# Patient Record
Sex: Female | Born: 1970 | Race: White | Hispanic: No | Marital: Married | Smoking: Former smoker
Health system: Southern US, Community
[De-identification: ages and names within clinical notes are randomized; demographics above are authoritative.]

## PROBLEM LIST (undated history)

## (undated) DIAGNOSIS — E079 Disorder of thyroid, unspecified: Secondary | ICD-10-CM

## (undated) HISTORY — PX: SPINE SURGERY: SHX786

## (undated) HISTORY — DX: Disorder of thyroid, unspecified: E07.9

---

## 1998-03-09 ENCOUNTER — Other Ambulatory Visit: Admission: RE | Admit: 1998-03-09 | Discharge: 1998-03-09 | Payer: Self-pay | Admitting: *Deleted

## 1999-03-22 ENCOUNTER — Other Ambulatory Visit: Admission: RE | Admit: 1999-03-22 | Discharge: 1999-03-22 | Payer: Self-pay | Admitting: *Deleted

## 2000-03-24 ENCOUNTER — Other Ambulatory Visit: Admission: RE | Admit: 2000-03-24 | Discharge: 2000-03-24 | Payer: Self-pay | Admitting: *Deleted

## 2001-07-14 ENCOUNTER — Other Ambulatory Visit: Admission: RE | Admit: 2001-07-14 | Discharge: 2001-07-14 | Payer: Self-pay | Admitting: Obstetrics & Gynecology

## 2002-08-05 ENCOUNTER — Other Ambulatory Visit: Admission: RE | Admit: 2002-08-05 | Discharge: 2002-08-05 | Payer: Self-pay | Admitting: Obstetrics & Gynecology

## 2003-03-02 ENCOUNTER — Encounter: Admission: RE | Admit: 2003-03-02 | Discharge: 2003-03-02 | Payer: Self-pay | Admitting: Unknown Physician Specialty

## 2003-03-16 ENCOUNTER — Ambulatory Visit (HOSPITAL_COMMUNITY): Admission: RE | Admit: 2003-03-16 | Discharge: 2003-03-17 | Payer: Self-pay | Admitting: Neurosurgery

## 2003-03-27 ENCOUNTER — Inpatient Hospital Stay (HOSPITAL_COMMUNITY): Admission: AD | Admit: 2003-03-27 | Discharge: 2003-04-01 | Payer: Self-pay | Admitting: Neurosurgery

## 2004-05-29 ENCOUNTER — Inpatient Hospital Stay (HOSPITAL_COMMUNITY): Admission: AD | Admit: 2004-05-29 | Discharge: 2004-05-31 | Payer: Self-pay | Admitting: Obstetrics and Gynecology

## 2004-06-01 ENCOUNTER — Encounter: Admission: RE | Admit: 2004-06-01 | Discharge: 2004-07-01 | Payer: Self-pay | Admitting: Obstetrics and Gynecology

## 2004-06-20 ENCOUNTER — Ambulatory Visit: Payer: Self-pay

## 2004-06-20 ENCOUNTER — Ambulatory Visit: Payer: Self-pay | Admitting: Family Medicine

## 2004-06-20 ENCOUNTER — Ambulatory Visit: Payer: Self-pay | Admitting: Cardiology

## 2004-07-02 ENCOUNTER — Encounter: Admission: RE | Admit: 2004-07-02 | Discharge: 2004-08-01 | Payer: Self-pay | Admitting: Obstetrics and Gynecology

## 2004-07-04 ENCOUNTER — Ambulatory Visit: Payer: Self-pay | Admitting: Family Medicine

## 2004-07-06 ENCOUNTER — Ambulatory Visit (HOSPITAL_COMMUNITY): Admission: RE | Admit: 2004-07-06 | Discharge: 2004-07-06 | Payer: Self-pay | Admitting: Obstetrics and Gynecology

## 2004-08-24 ENCOUNTER — Ambulatory Visit: Payer: Self-pay | Admitting: Family Medicine

## 2004-08-27 ENCOUNTER — Emergency Department (HOSPITAL_COMMUNITY): Admission: EM | Admit: 2004-08-27 | Discharge: 2004-08-27 | Payer: Self-pay | Admitting: Family Medicine

## 2004-09-01 ENCOUNTER — Encounter: Admission: RE | Admit: 2004-09-01 | Discharge: 2004-09-17 | Payer: Self-pay | Admitting: Obstetrics and Gynecology

## 2004-11-06 ENCOUNTER — Ambulatory Visit: Payer: Self-pay | Admitting: Family Medicine

## 2005-06-04 ENCOUNTER — Ambulatory Visit: Payer: Self-pay | Admitting: Family Medicine

## 2005-11-15 ENCOUNTER — Ambulatory Visit: Payer: Self-pay | Admitting: Family Medicine

## 2006-05-06 ENCOUNTER — Ambulatory Visit: Payer: Self-pay | Admitting: Family Medicine

## 2007-03-07 ENCOUNTER — Inpatient Hospital Stay (HOSPITAL_COMMUNITY): Admission: AD | Admit: 2007-03-07 | Discharge: 2007-03-11 | Payer: Self-pay | Admitting: Obstetrics and Gynecology

## 2007-03-12 ENCOUNTER — Encounter: Admission: RE | Admit: 2007-03-12 | Discharge: 2007-04-08 | Payer: Self-pay | Admitting: Obstetrics and Gynecology

## 2007-04-09 ENCOUNTER — Encounter: Admission: RE | Admit: 2007-04-09 | Discharge: 2007-05-09 | Payer: Self-pay | Admitting: Obstetrics and Gynecology

## 2007-05-10 ENCOUNTER — Encounter: Admission: RE | Admit: 2007-05-10 | Discharge: 2007-05-27 | Payer: Self-pay | Admitting: Obstetrics and Gynecology

## 2010-06-05 NOTE — Op Note (Signed)
[name], [name]              ACCOUNT NO.:  [id number]   MEDICAL RECORD NO.:  [id number]          PATIENT TYPE:  INP   LOCATION:  9123                          FACILITY:  [facility]   PHYSICIAN:  Lenoard Aden, M.D.DATE OF BIRTH:  [birth year]   DATE OF PROCEDURE:  03/08/2007  DATE OF DISCHARGE:                               OPERATIVE REPORT   PREOPERATIVE DIAGNOSIS:  Previous cesarean section x2 in active labor at  term.   POSTOPERATIVE DIAGNOSIS:  Previous cesarean section x2 in active labor  at term.   PROCEDURE:  Repeat low segment transverse cesarean section.   SURGEON:  Lenoard Aden, M.D.   ASSISTANT:  Fredric Mare.   ANESTHESIA:  Spinal by Hatchett.   ESTIMATED BLOOD LOSS:  1000 mL.   COMPLICATIONS:  None.   DRAINS:  Foley.   COUNTS:  Correct.   Patient to recovery in good condition.   BRIEF OP NOTE:  After being apprised of risks of anesthesia, infection,  bleeding, injury to abdominal organs and need for repair, delayed versus  immediate complications to include bowel or bladder injury, the patient  was brought to the operating room.  She was administered spinal  anesthetic without complications, prepped, draped in usual sterile  fashion.  Foley catheter placed after achieving anesthesia and Marcaine  solution placed.  Pfannenstiel skin incision made with scalpel, carried  down to fascia which is nicked in midline and extended transversely  using Mayo scissors.  Rectus muscles dissected sharply in midline,  peritoneum entered sharply.  Bladder blade placed.  Visceral peritoneum  scored sharply off the lower uterine segment, lower uterine segment is  thin and with a small 1 cm window noted.  No dehiscence of the scar at  this time.  Sharl Ma hysterotomy incision made, atraumatic delivery from an  OT position, full-term living female infant, Apgars 8/9.  Cord blood  collected.  Placenta delivered manually intact from posterior location,  three-vessel cord noted.   Uterus curetted using a dry lap pack and  closed in two running  imbricating layers of 0-0 Monocryl suture.  Irrigation accomplished.  Bladder flap inspected and found to be hemostatic.  Fascia closed using  0-0 Monocryl suture in running fashion.  Skin closed using staples.  The  patient tolerated this procedure well, transferred to recovery good  condition.      Lenoard Aden, M.D.  Electronically Signed     RJT/MEDQ  D:  03/08/2007  T:  03/09/2007  Job:  [id number]

## 2010-06-05 NOTE — Discharge Summary (Signed)
Sarah Vaughan, Sarah Vaughan              ACCOUNT NO.:  [id number]   MEDICAL RECORD NO.:  [id number]          PATIENT TYPE:  INP   LOCATION:  9123                          FACILITY:  [facility]   PHYSICIAN:  Lenoard Aden, M.D.DATE OF BIRTH:  1970/03/30   DATE OF ADMISSION:  03/08/2007  DATE OF DISCHARGE:  03/11/2007                               DISCHARGE SUMMARY   HISTORY OF PRESENT ILLNESS:  The patient with uncomplicated repeat C-  section for previous C-section and active labor.  Postoperative course  uncomplicated.  Tolerated regular diet well.   Discharged to home on day 3.  Discharge teaching was done.   DISCHARGE MEDICATIONS:  Tylox, prenatal vitamins, and iron given.   FOLLOWUP:  Followup in the office in 4-6 weeks.      Lenoard Aden, M.D.  Electronically Signed     RJT/MEDQ  D:  04/03/2007  T:  04/04/2007  Job:  [id number]

## 2010-06-08 NOTE — H&P (Signed)
Sarah Vaughan, Sarah Vaughan              ACCOUNT NO.:  [id number]   MEDICAL RECORD NO.:  [id number]          PATIENT TYPE:  INP   LOCATION:  9168                          FACILITY:  [facility]   PHYSICIAN:  Lenoard Aden, M.D.DATE OF BIRTH:  01/31/1970   DATE OF ADMISSION:  05/29/2004  DATE OF DISCHARGE:                                HISTORY & PHYSICAL   CHIEF COMPLAINT:  Labor.   HISTORY OF PRESENT ILLNESS:  The patient is a 39 year old white female G2 P1  at 5 and six-sevenths weeks gestation with increased frequency of  contractions. She has a history of C-section at 40 weeks for active phase  arrest in 1997. She has no known drug allergies.   MEDICATIONS:  Prenatal vitamins.   FAMILY HISTORY:  Myocardial infarction, insulin-dependent diabetes.   PERSONAL HISTORY:  An L4-L5 ruptured disc with cerebrospinal fluid leak  which has been consulted with her neurosurgeon who approves her for regional  anesthesia.   PRENATAL LABORATORY DATA:  Reveals a blood type of A positive, Rh antibody  negative. Rubella immune. Hepatitis nonreactive. HIV nonreactive. Pregnancy  course otherwise uncomplicated.   PHYSICAL EXAMINATION:  GENERAL:  She is a well-developed, well-nourished  white female in no acute distress.  HEENT:  Normal.  LUNGS:  Clear.  HEART:  Regular rate and rhythm.  ABDOMEN:  Soft, gravid, nontender. Estimated fetal weight 7-and-a-half to 8  pounds.  PELVIC:  Cervix is 2 cm, 80%, vertex, -1.  EXTREMITIES:  Reveal no cords.  NEUROLOGIC:  Nonfocal.   IMPRESSION:  1.  Thirty-nine-week OB.  2.  Previous cesarean section, for attempt at vaginal birth after cesarean.  3.  Early labor.   PLAN:  To [facility] for amniotomy, IUPC placement, augmentation with  Pitocin as needed. Anticipate attempts at vaginal delivery. Of note,  risks/benefits of VBAC versus repeat C-section are discussed, failure risk  noted. Uterine rupture rate of approximately 1-3% quoted to the patient  and  her husband. They acknowledge this and desire to proceed.      RJT/MEDQ  D:  05/29/2004  T:  05/29/2004  Job:  [id number]

## 2010-06-11 ENCOUNTER — Emergency Department (HOSPITAL_COMMUNITY)
Admission: EM | Admit: 2010-06-11 | Discharge: 2010-06-11 | Disposition: A | Discharge status: Home / Self Care | Payer: BC Managed Care – PPO | Attending: Emergency Medicine | Admitting: Emergency Medicine

## 2010-06-11 ENCOUNTER — Emergency Department (HOSPITAL_COMMUNITY): Payer: BC Managed Care – PPO

## 2010-06-11 ENCOUNTER — Inpatient Hospital Stay (INDEPENDENT_AMBULATORY_CARE_PROVIDER_SITE_OTHER)
Admission: RE | Admit: 2010-06-11 | Discharge: 2010-06-11 | Disposition: A | Discharge status: Home / Self Care | Payer: BC Managed Care – PPO | Source: Ambulatory Visit | Attending: Family Medicine | Admitting: Family Medicine

## 2010-06-11 DIAGNOSIS — R110 Nausea: Secondary | ICD-10-CM | POA: Insufficient documentation

## 2010-06-11 DIAGNOSIS — R10811 Right upper quadrant abdominal tenderness: Secondary | ICD-10-CM

## 2010-06-11 DIAGNOSIS — M549 Dorsalgia, unspecified: Secondary | ICD-10-CM | POA: Insufficient documentation

## 2010-06-11 DIAGNOSIS — R1011 Right upper quadrant pain: Secondary | ICD-10-CM | POA: Insufficient documentation

## 2010-06-11 DIAGNOSIS — R197 Diarrhea, unspecified: Secondary | ICD-10-CM

## 2010-06-11 LAB — COMPREHENSIVE METABOLIC PANEL WITH GFR
ALT: 10 U/L (ref 0–35)
AST: 17 U/L (ref 0–37)
Albumin: 3.6 g/dL (ref 3.5–5.2)
Alkaline Phosphatase: 68 U/L (ref 39–117)
BUN: 11 mg/dL (ref 6–23)
CO2: 25 meq/L (ref 19–32)
Calcium: 9 mg/dL (ref 8.4–10.5)
Chloride: 103 meq/L (ref 96–112)
Creatinine, Ser: 0.54 mg/dL (ref 0.4–1.2)
GFR calc non Af Amer: >60 mL/min
Glucose, Bld: 89 mg/dL (ref 70–99)
Potassium: 4 meq/L (ref 3.5–5.1)
Sodium: 136 meq/L (ref 135–145)
Total Bilirubin: 0.5 mg/dL (ref 0.3–1.2)
Total Protein: 6.4 g/dL (ref 6.0–8.3)

## 2010-06-11 LAB — POCT PREGNANCY, URINE: Preg Test, Ur: NEGATIVE

## 2010-06-11 LAB — CBC
HCT: 39.2 % (ref 36.0–46.0)
Hemoglobin: 13.4 g/dL (ref 12.0–15.0)
MCH: 32.5 pg (ref 26.0–34.0)
MCHC: 34.2 g/dL (ref 30.0–36.0)
MCV: 95.1 fL (ref 78.0–100.0)
Platelets: 241 10*3/uL (ref 150–400)
RBC: 4.12 MIL/uL (ref 3.87–5.11)
RDW: 12.4 % (ref 11.5–15.5)
WBC: 10.3 10*3/uL (ref 4.0–10.5)

## 2010-06-11 LAB — POCT URINALYSIS DIP (DEVICE)
Bilirubin Urine: NEGATIVE
Glucose, UA: NEGATIVE mg/dL
Ketones, ur: NEGATIVE mg/dL
Nitrite: NEGATIVE
Protein, ur: NEGATIVE mg/dL
Specific Gravity, Urine: 1.02 (ref 1.005–1.030)
Urobilinogen, UA: 0.2 mg/dL (ref 0.0–1.0)
pH: 5.5 (ref 5.0–8.0)

## 2010-06-11 LAB — DIFFERENTIAL
Basophils Absolute: 0 10*3/uL (ref 0.0–0.1)
Basophils Relative: 0 % (ref 0–1)
Eosinophils Absolute: 0.1 10*3/uL (ref 0.0–0.7)
Eosinophils Relative: 1 % (ref 0–5)

## 2010-06-11 LAB — LIPASE, BLOOD: Lipase: 21 U/L (ref 11–59)

## 2010-06-13 ENCOUNTER — Other Ambulatory Visit (HOSPITAL_COMMUNITY): Payer: Self-pay | Admitting: Family Medicine

## 2010-06-13 DIAGNOSIS — R1011 Right upper quadrant pain: Secondary | ICD-10-CM

## 2010-06-27 ENCOUNTER — Encounter (HOSPITAL_COMMUNITY)
Admission: RE | Admit: 2010-06-27 | Discharge: 2010-06-27 | Disposition: A | Discharge status: Home / Self Care | Payer: BC Managed Care – PPO | Source: Ambulatory Visit | Attending: Family Medicine | Admitting: Family Medicine

## 2010-06-27 DIAGNOSIS — R1011 Right upper quadrant pain: Secondary | ICD-10-CM | POA: Insufficient documentation

## 2010-06-27 MED ORDER — TECHNETIUM TC 99M MEBROFENIN IV KIT
5.0000 | PACK | Freq: Once | INTRAVENOUS | Status: AC | PRN
  Administered 2010-06-27: 5 via INTRAVENOUS

## 2010-07-30 ENCOUNTER — Encounter (HOSPITAL_COMMUNITY)
Admission: RE | Admit: 2010-07-30 | Discharge: 2010-07-30 | Disposition: A | Discharge status: Home / Self Care | Payer: BC Managed Care – PPO | Source: Ambulatory Visit | Attending: Neurosurgery | Admitting: Neurosurgery

## 2010-07-30 LAB — CBC
HCT: 37 % (ref 36.0–46.0)
Hemoglobin: 12.7 g/dL (ref 12.0–15.0)
MCH: 32.8 pg (ref 26.0–34.0)
MCHC: 34.3 g/dL (ref 30.0–36.0)
MCV: 95.6 fL (ref 78.0–100.0)
Platelets: 263 10*3/uL (ref 150–400)
RBC: 3.87 MIL/uL (ref 3.87–5.11)
RDW: 12.8 % (ref 11.5–15.5)
WBC: 6.2 10*3/uL (ref 4.0–10.5)

## 2010-07-30 LAB — SURGICAL PCR SCREEN
MRSA, PCR: NEGATIVE
Staphylococcus aureus: NEGATIVE

## 2010-07-30 LAB — HCG, SERUM, QUALITATIVE: Preg, Serum: NEGATIVE

## 2010-08-01 ENCOUNTER — Observation Stay (HOSPITAL_COMMUNITY): Payer: BC Managed Care – PPO

## 2010-08-01 ENCOUNTER — Observation Stay (HOSPITAL_COMMUNITY)
Admission: RE | Admit: 2010-08-01 | Discharge: 2010-08-02 | Disposition: A | Discharge status: Home / Self Care | Payer: BC Managed Care – PPO | Source: Ambulatory Visit | Attending: Neurosurgery | Admitting: Neurosurgery

## 2010-08-01 DIAGNOSIS — M47817 Spondylosis without myelopathy or radiculopathy, lumbosacral region: Secondary | ICD-10-CM | POA: Insufficient documentation

## 2010-08-01 DIAGNOSIS — Z01812 Encounter for preprocedural laboratory examination: Secondary | ICD-10-CM | POA: Insufficient documentation

## 2010-08-01 DIAGNOSIS — M5126 Other intervertebral disc displacement, lumbar region: Principal | ICD-10-CM | POA: Insufficient documentation

## 2010-08-01 DIAGNOSIS — M5137 Other intervertebral disc degeneration, lumbosacral region: Secondary | ICD-10-CM | POA: Insufficient documentation

## 2010-08-01 DIAGNOSIS — M51379 Other intervertebral disc degeneration, lumbosacral region without mention of lumbar back pain or lower extremity pain: Secondary | ICD-10-CM | POA: Insufficient documentation

## 2010-08-06 NOTE — Op Note (Signed)
Sarah Vaughan, Sarah Vaughan              ACCOUNT NO.:  [id number]  MEDICAL RECORD NO.:  [id number]  LOCATION:  3528                         FACILITY:  [facility]  PHYSICIAN:  Hewitt Shorts, M.D.DATE OF BIRTH:  09-01-1970  DATE OF PROCEDURE:  08/01/2010 DATE OF DISCHARGE:                              OPERATIVE REPORT   PREOPERATIVE DIAGNOSES:  Recurrent left L5-S1 lumbar disk herniation, lumbar degenerative disease, lumbar spondylosis and lumbar radiculopathy.  POSTOPERATIVE DIAGNOSES:  Recurrent left L5-S1 lumbar disk herniation, lumbar degenerative disease, lumbar spondylosis and lumbar radiculopathy.  PROCEDURE:  Left L5-S1 lumbar laminotomy and microdiskectomy with microdissection and the operative microscope.  SURGEON:  Hewitt Shorts, MD.  ASSISTANCE:  Dr. Venetia Maxon.  ANESTHESIA:  General endotracheal.  INDICATIONS:  The patient is a 40 year old woman about 7 years status post previous lumbar diskectomy.  It was complicated by a delayed postoperative cerebrospinal fluid leak that required reoperation, but she has done up until a couple of months ago which developed recurrent left lumbar radicular pain.  MRI scan revealed a recurrent left L5-S1 lumbar disk herniation.  A decision was made to proceed with laminotomy and microdiskectomy.  PROCEDURE:  The patient was brought to the operating room, placed under general endotracheal anesthesia.  The patient was turned to prone position.  Lumbar region was prepped with Betadine soap solution and draped in a sterile fashion.  The midline was infiltrated with local anesthetic with epinephrine and x-ray was taken.  The L5-S1 level was identified and then a previous midline incision was reopened and dissection was carried down through the subcutaneous tissue.  Bipolar cautery and electrocautery were used to maintain hemostasis.  Dissection was carried down to the lumbar fascia, which was incised on the left side of the midline.   The paraspinal muscles were dissected from the spinous process and lamina in a subperiosteal fashion.  We identified the lamina of S1 and the left L5-S1 facet complex, another x-ray was taken to further localize and then we defined the margins of the previous laminotomy and extended the left S1 laminotomy and left L5-S1 medial facetectomy.  The operating microscope was draped and brought into field to provide additional magnification, illumination, and visualization.  The remainder of the decompression was performed using microdissection and microsurgical technique.  We dissected through epidural scar.  We encountered what appeared to be Tisseel used at the time of the repair of dural laceration.  This was removed.  We then dissected the lateral epidural space through the ventral aspect of the spinal canal and identified the L5-S1 annulus and we then began to mobilize the left S1 nerve root and thecal sac medially exposing the large disk herniation.  The remaining annular fibers were incised and removed fragments of subligamentous disk herniation and then removed spinal overgrowth on the posterior aspect of L5 and S1 and then continued through the disk space and we continued the diskectomy using variety of micro curettes, pituitary rongeurs.  We then continued the diskectomy medially decompressing the thecal sac and nerve roots and at the end we felt like good decompression of thecal sac and nerve root was achieved and all loose fragments of disk material were removed from both disk  space and the epidural space.  Once the diskectomy was completed, hemostasis was established with the use of Gelfoam soaked in thrombin and then once hemostasis was confirmed, we instilled 2 mL of fentanyl and 80 mg of Depo-Medrol into the epidural space and then proceeded with closure.  Deep fascia was closed with interrupted undyed 1 Vicryl sutures.  Scarpa fascia was closed with interrupted 1-0 and 2-0  undyed Vicryl sutures and the subcutaneous and subcuticular were closed with interrupted inverted 2-0 and 3-0 undyed Vicryl sutures.  The skin was approximated with Dermabond.  The procedure was tolerated well.  The estimated blood loss was less than 25 mL.  Sponge count was correct. Following surgery, the patient was returned back to the supine position to be reversed from the anesthetic, extubated, and transferred to the recovery room for further care.     Hewitt Shorts, M.D.     RWN/MEDQ  D:  08/01/2010  T:  08/02/2010  Job:  [id number]  Electronically Signed by Shirlean Kelly M.D. on 08/06/2010 07:36:10 AM

## 2010-10-12 LAB — CBC
HCT: 31 — ABNORMAL LOW
Hemoglobin: 11.1 — ABNORMAL LOW
MCHC: 35.5
MCV: 97.9
MCV: 98.3
Platelets: 206
RBC: 3.17 — ABNORMAL LOW
RBC: 3.41 — ABNORMAL LOW
WBC: 7.9

## 2010-12-31 ENCOUNTER — Other Ambulatory Visit: Payer: Self-pay | Admitting: Obstetrics and Gynecology

## 2010-12-31 DIAGNOSIS — Z1231 Encounter for screening mammogram for malignant neoplasm of breast: Secondary | ICD-10-CM

## 2011-01-31 ENCOUNTER — Ambulatory Visit: Payer: BC Managed Care – PPO

## 2012-12-04 ENCOUNTER — Ambulatory Visit (INDEPENDENT_AMBULATORY_CARE_PROVIDER_SITE_OTHER): Payer: BC Managed Care – PPO | Admitting: Physician Assistant

## 2012-12-04 ENCOUNTER — Encounter: Payer: Self-pay | Admitting: Physician Assistant

## 2012-12-04 ENCOUNTER — Encounter (INDEPENDENT_AMBULATORY_CARE_PROVIDER_SITE_OTHER): Payer: Self-pay

## 2012-12-04 VITALS — BP 140/81 | HR 85 | Temp 97.2°F | Ht 64.5 in | Wt 165.0 lb

## 2012-12-04 DIAGNOSIS — IMO0002 Reserved for concepts with insufficient information to code with codable children: Secondary | ICD-10-CM

## 2012-12-04 MED ORDER — OXYCODONE-ACETAMINOPHEN 5-325 MG PO TABS: 1.0000 | ORAL_TABLET | Freq: Four times a day (QID) | ORAL | Status: DC | PRN

## 2012-12-04 NOTE — Progress Notes (Signed)
  Subjective:    Patient ID: Sarah Vaughan, female    DOB: [birth year], 42 y.o.   MRN: [id number]  HPI 42 y/o female with hx of DDD and 2 previous spine surgeries presents with upper back/ neck pain and tingling in left arm radiating to forearm since Sunday. She has had one chiropractic visit yesterday with mild relief. She has called her neurosurgeon and is scheduled to see him in 3 weeks. Has tried a muscle relaxer she had at home (Robaxin) with minimal relief of pain and no relief of tingling in arm and numbness in fingertips. She presents with xrays that were taken at chiropractor.     Review of Systems  Constitutional: Positive for activity change (due to back/ neck pain). Negative for fever, chills, diaphoresis, appetite change, fatigue and unexpected weight change.  HENT: Negative.   Eyes: Negative.   Respiratory: Negative.   Cardiovascular: Negative.   Endocrine: Negative.   Genitourinary: Negative.   Musculoskeletal: Positive for back pain (along C5 spinal region), neck pain and neck stiffness. Negative for arthralgias, gait problem, joint swelling and myalgias.  Skin: Negative for color change, pallor, rash and wound.  Neurological: Positive for numbness (intermittent in fingertips on 2nd, 3rd, 4th digits, LUE). Negative for dizziness, tremors, seizures, syncope, facial asymmetry, speech difficulty, weakness, light-headedness and headaches.  Psychiatric/Behavioral: Negative.        Objective:   Physical Exam  Constitutional: She is oriented to person, place, and time. She appears well-developed and well-nourished. No distress.  HENT:  Head: Normocephalic and atraumatic.  Neck: No spinous process tenderness and no muscular tenderness present. Rigidity present. Decreased range of motion (increased pain in LUE with right sided neck flexion) present. No thyromegaly present.  Reduced resisted lateral flexion on right side. Full rom on forward flexion. Full ROM on rotation.    Pulmonary/Chest: Effort normal and breath sounds normal. No respiratory distress.  Musculoskeletal: She exhibits no edema and no tenderness.       Right shoulder: She exhibits normal strength.  xrays indicate narrowing of disk spaces. Hx of DDD. Mildly decreased sensory on fingertips of RUE.   Neurological: She is alert and oriented to person, place, and time. She has normal reflexes.  Skin: She is not diaphoretic.          Assessment & Plan:  1. DDD with possible nerve compression in cervical/thoracic region, leading to RUE numbness/tingling and pain. Due to patient's history and since the problem does not appear to be muscular in origin, therefore indicating why muscle relaxants are not alleviating s/s, I will prescribe narcotic pain relief for patient. She has taken Percocet in the past on short term basis, which relieved pain in previous episodes so I prescribed a short term prescription until she can get in to see Dr. Bettina Gavia. She will continue chiropractic visits as well. I also suggested alternating hot/cold compresses to AA.

## 2012-12-06 ENCOUNTER — Encounter: Payer: Self-pay | Admitting: Physician Assistant

## 2013-08-21 ENCOUNTER — Inpatient Hospital Stay (HOSPITAL_COMMUNITY)
Admission: EM | Admit: 2013-08-21 | Discharge: 2013-08-24 | DRG: 494 | Disposition: A | Discharge status: Home / Self Care | Payer: BC Managed Care – PPO | Attending: Orthopaedic Surgery | Admitting: Orthopaedic Surgery

## 2013-08-21 ENCOUNTER — Emergency Department (HOSPITAL_COMMUNITY): Payer: BC Managed Care – PPO

## 2013-08-21 ENCOUNTER — Encounter (HOSPITAL_COMMUNITY): Payer: Self-pay | Admitting: Emergency Medicine

## 2013-08-21 DIAGNOSIS — M25569 Pain in unspecified knee: Secondary | ICD-10-CM | POA: Diagnosis not present

## 2013-08-21 DIAGNOSIS — S82001A Unspecified fracture of right patella, initial encounter for closed fracture: Secondary | ICD-10-CM

## 2013-08-21 DIAGNOSIS — S0180XA Unspecified open wound of other part of head, initial encounter: Secondary | ICD-10-CM | POA: Diagnosis present

## 2013-08-21 DIAGNOSIS — E039 Hypothyroidism, unspecified: Secondary | ICD-10-CM | POA: Diagnosis present

## 2013-08-21 DIAGNOSIS — Z6831 Body mass index (BMI) 31.0-31.9, adult: Secondary | ICD-10-CM

## 2013-08-21 DIAGNOSIS — S82009A Unspecified fracture of unspecified patella, initial encounter for closed fracture: Secondary | ICD-10-CM | POA: Diagnosis not present

## 2013-08-21 DIAGNOSIS — Y92410 Unspecified street and highway as the place of occurrence of the external cause: Secondary | ICD-10-CM

## 2013-08-21 DIAGNOSIS — Z87891 Personal history of nicotine dependence: Secondary | ICD-10-CM

## 2013-08-21 DIAGNOSIS — K219 Gastro-esophageal reflux disease without esophagitis: Secondary | ICD-10-CM | POA: Diagnosis present

## 2013-08-21 LAB — RAPID URINE DRUG SCREEN, HOSP PERFORMED
Amphetamines: NOT DETECTED
BARBITURATES: NOT DETECTED
Benzodiazepines: NOT DETECTED
COCAINE: NOT DETECTED
Opiates: NOT DETECTED
TETRAHYDROCANNABINOL: NOT DETECTED

## 2013-08-21 LAB — CBC WITH DIFFERENTIAL/PLATELET
Basophils Absolute: 0 10*3/uL (ref 0.0–0.1)
Basophils Relative: 0 % (ref 0–1)
EOS ABS: 0.1 10*3/uL (ref 0.0–0.7)
Eosinophils Relative: 1 % (ref 0–5)
HCT: 36.1 % (ref 36.0–46.0)
HEMOGLOBIN: 12.3 g/dL (ref 12.0–15.0)
LYMPHS ABS: 1.2 10*3/uL (ref 0.7–4.0)
LYMPHS PCT: 9 % — AB (ref 12–46)
MCH: 32.7 pg (ref 26.0–34.0)
MCHC: 34.1 g/dL (ref 30.0–36.0)
MCV: 96 fL (ref 78.0–100.0)
MONOS PCT: 6 % (ref 3–12)
Monocytes Absolute: 0.8 10*3/uL (ref 0.1–1.0)
NEUTROS ABS: 11.8 10*3/uL — AB (ref 1.7–7.7)
NEUTROS PCT: 84 % — AB (ref 43–77)
PLATELETS: 262 10*3/uL (ref 150–400)
RBC: 3.76 MIL/uL — AB (ref 3.87–5.11)
RDW: 12.5 % (ref 11.5–15.5)
WBC: 13.9 10*3/uL — AB (ref 4.0–10.5)

## 2013-08-21 LAB — COMPREHENSIVE METABOLIC PANEL
ALK PHOS: 78 U/L (ref 39–117)
ALT: 15 U/L (ref 0–35)
AST: 22 U/L (ref 0–37)
Albumin: 3.8 g/dL (ref 3.5–5.2)
Anion gap: 15 (ref 5–15)
BILIRUBIN TOTAL: 0.2 mg/dL — AB (ref 0.3–1.2)
BUN: 15 mg/dL (ref 6–23)
CHLORIDE: 103 meq/L (ref 96–112)
CO2: 21 meq/L (ref 19–32)
Calcium: 9 mg/dL (ref 8.4–10.5)
Creatinine, Ser: 0.55 mg/dL (ref 0.50–1.10)
GFR calc non Af Amer: >90 mL/min (ref >90)
GLUCOSE: 109 mg/dL — AB (ref 70–99)
POTASSIUM: 3.8 meq/L (ref 3.7–5.3)
SODIUM: 139 meq/L (ref 137–147)
TOTAL PROTEIN: 6.9 g/dL (ref 6.0–8.3)

## 2013-08-21 LAB — ETHANOL

## 2013-08-21 LAB — POC URINE PREG, ED: Preg Test, Ur: NEGATIVE

## 2013-08-21 MED ORDER — MORPHINE SULFATE 4 MG/ML IJ SOLN
4.0000 mg | Freq: Once | INTRAMUSCULAR | Status: AC
  Administered 2013-08-21: 4 mg via INTRAVENOUS
  Filled 2013-08-21: qty 1

## 2013-08-21 MED ORDER — ONDANSETRON HCL 4 MG/2ML IJ SOLN
4.0000 mg | Freq: Once | INTRAMUSCULAR | Status: AC
  Administered 2013-08-21: 4 mg via INTRAVENOUS
  Filled 2013-08-21: qty 2

## 2013-08-21 MED ORDER — SODIUM CHLORIDE 0.9 % IV BOLUS (SEPSIS)
500.0000 mL | Freq: Once | INTRAVENOUS | Status: AC
  Administered 2013-08-21: 500 mL via INTRAVENOUS

## 2013-08-21 NOTE — ED Notes (Signed)
Pt being sutured at this time.  Pt remains alert and oriented x's 3.  Pain med given
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
## 2013-08-21 NOTE — Progress Notes (Signed)
Orthopedic Tech Progress Note Patient Details:  Sarah KaufmannKimberly C Vaughan 11/15/1970 [id number]  Ortho Devices Type of Ortho Device: Knee Immobilizer Ortho Device/Splint Interventions: Application   Haskell Flirtewsome, Millard Bautch M 08/21/2013, 11:30 PM

## 2013-08-21 NOTE — ED Notes (Signed)
Pt in x-ray at this time
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
## 2013-08-22 ENCOUNTER — Inpatient Hospital Stay (HOSPITAL_COMMUNITY): Payer: BC Managed Care – PPO

## 2013-08-22 ENCOUNTER — Inpatient Hospital Stay (HOSPITAL_COMMUNITY): Payer: BC Managed Care – PPO | Admitting: Anesthesiology

## 2013-08-22 ENCOUNTER — Encounter (HOSPITAL_COMMUNITY): Payer: BC Managed Care – PPO | Admitting: Anesthesiology

## 2013-08-22 ENCOUNTER — Encounter (HOSPITAL_COMMUNITY)
Admission: EM | Disposition: A | Discharge status: Home / Self Care | Payer: Self-pay | Source: Home / Self Care | Attending: Orthopaedic Surgery

## 2013-08-22 ENCOUNTER — Encounter (HOSPITAL_COMMUNITY): Payer: Self-pay | Admitting: Anesthesiology

## 2013-08-22 DIAGNOSIS — Z6831 Body mass index (BMI) 31.0-31.9, adult: Secondary | ICD-10-CM | POA: Diagnosis not present

## 2013-08-22 DIAGNOSIS — Y92410 Unspecified street and highway as the place of occurrence of the external cause: Secondary | ICD-10-CM | POA: Diagnosis not present

## 2013-08-22 DIAGNOSIS — E039 Hypothyroidism, unspecified: Secondary | ICD-10-CM | POA: Diagnosis present

## 2013-08-22 DIAGNOSIS — M25569 Pain in unspecified knee: Secondary | ICD-10-CM | POA: Diagnosis present

## 2013-08-22 DIAGNOSIS — Z87891 Personal history of nicotine dependence: Secondary | ICD-10-CM | POA: Diagnosis not present

## 2013-08-22 DIAGNOSIS — S82009A Unspecified fracture of unspecified patella, initial encounter for closed fracture: Secondary | ICD-10-CM

## 2013-08-22 DIAGNOSIS — K219 Gastro-esophageal reflux disease without esophagitis: Secondary | ICD-10-CM | POA: Diagnosis present

## 2013-08-22 DIAGNOSIS — S0180XA Unspecified open wound of other part of head, initial encounter: Secondary | ICD-10-CM | POA: Diagnosis present

## 2013-08-22 HISTORY — PX: ORIF PATELLA: SHX5033

## 2013-08-22 LAB — SURGICAL PCR SCREEN
MRSA, PCR: NEGATIVE
Staphylococcus aureus: NEGATIVE

## 2013-08-22 SURGERY — OPEN REDUCTION INTERNAL FIXATION (ORIF) PATELLA
Anesthesia: General | Site: Knee | Laterality: Right

## 2013-08-22 MED ORDER — VECURONIUM BROMIDE 10 MG IV SOLR
INTRAVENOUS | Status: DC | PRN
  Administered 2013-08-22: 4 mg via INTRAVENOUS

## 2013-08-22 MED ORDER — BISACODYL 10 MG RE SUPP: 10.0000 mg | Freq: Every day | RECTAL | Status: DC | PRN

## 2013-08-22 MED ORDER — PROPOFOL INFUSION 10 MG/ML OPTIME
INTRAVENOUS | Status: DC | PRN
  Administered 2013-08-22: 25 ug/kg/min via INTRAVENOUS

## 2013-08-22 MED ORDER — DEXTROSE 5 % IV SOLN
INTRAVENOUS | Status: DC | PRN
  Administered 2013-08-22: 18:00:00 via INTRAVENOUS

## 2013-08-22 MED ORDER — GLYCOPYRROLATE 0.2 MG/ML IJ SOLN
INTRAMUSCULAR | Status: AC
  Filled 2013-08-22: qty 3

## 2013-08-22 MED ORDER — LACTATED RINGERS IV SOLN
INTRAVENOUS | Status: DC | PRN
  Administered 2013-08-22 (×2): via INTRAVENOUS

## 2013-08-22 MED ORDER — SCOPOLAMINE 1 MG/3DAYS TD PT72
1.0000 | MEDICATED_PATCH | Freq: Once | TRANSDERMAL | Status: DC
  Administered 2013-08-23: 1.5 mg via TRANSDERMAL
  Filled 2013-08-22 (×2): qty 1

## 2013-08-22 MED ORDER — SCOPOLAMINE 1 MG/3DAYS TD PT72
MEDICATED_PATCH | TRANSDERMAL | Status: AC
  Filled 2013-08-22: qty 1

## 2013-08-22 MED ORDER — POTASSIUM CHLORIDE IN NACL 20-0.45 MEQ/L-% IV SOLN
INTRAVENOUS | Status: DC
  Administered 2013-08-22: 12:00:00 via INTRAVENOUS
  Filled 2013-08-22 (×2): qty 1000

## 2013-08-22 MED ORDER — ONDANSETRON HCL 4 MG/2ML IJ SOLN: 4.0000 mg | Freq: Four times a day (QID) | INTRAMUSCULAR | Status: DC | PRN

## 2013-08-22 MED ORDER — 0.9 % SODIUM CHLORIDE (POUR BTL) OPTIME
TOPICAL | Status: DC | PRN
  Administered 2013-08-22: 1000 mL

## 2013-08-22 MED ORDER — LIDOCAINE HCL (CARDIAC) 20 MG/ML IV SOLN
INTRAVENOUS | Status: AC
  Filled 2013-08-22: qty 10

## 2013-08-22 MED ORDER — FENTANYL CITRATE 0.05 MG/ML IJ SOLN
INTRAMUSCULAR | Status: AC
  Filled 2013-08-22: qty 5

## 2013-08-22 MED ORDER — HYDROMORPHONE HCL PF 1 MG/ML IJ SOLN
INTRAMUSCULAR | Status: AC
  Filled 2013-08-22: qty 1

## 2013-08-22 MED ORDER — CHLORHEXIDINE GLUCONATE 4 % EX LIQD
60.0000 mL | Freq: Once | CUTANEOUS | Status: DC
  Filled 2013-08-22: qty 60

## 2013-08-22 MED ORDER — GLYCOPYRROLATE 0.2 MG/ML IJ SOLN
INTRAMUSCULAR | Status: DC | PRN
  Administered 2013-08-22: 0.3 mg via INTRAVENOUS

## 2013-08-22 MED ORDER — METOCLOPRAMIDE HCL 5 MG/ML IJ SOLN: 5.0000 mg | Freq: Three times a day (TID) | INTRAMUSCULAR | Status: DC | PRN

## 2013-08-22 MED ORDER — DEXAMETHASONE SODIUM PHOSPHATE 4 MG/ML IJ SOLN
INTRAMUSCULAR | Status: AC
  Filled 2013-08-22: qty 2

## 2013-08-22 MED ORDER — HYDROMORPHONE HCL PF 1 MG/ML IJ SOLN
0.5000 mg | INTRAMUSCULAR | Status: DC | PRN
  Administered 2013-08-23 – 2013-08-24 (×4): 1 mg via INTRAVENOUS
  Filled 2013-08-22 (×3): qty 1

## 2013-08-22 MED ORDER — METHOCARBAMOL 500 MG PO TABS
500.0000 mg | ORAL_TABLET | Freq: Four times a day (QID) | ORAL | Status: DC | PRN
  Administered 2013-08-23: 500 mg via ORAL
  Filled 2013-08-22: qty 1

## 2013-08-22 MED ORDER — ONDANSETRON HCL 4 MG/2ML IJ SOLN
INTRAMUSCULAR | Status: DC | PRN
  Administered 2013-08-22: 4 mg via INTRAVENOUS

## 2013-08-22 MED ORDER — OXYCODONE-ACETAMINOPHEN 5-325 MG PO TABS
1.0000 | ORAL_TABLET | ORAL | Status: DC | PRN
  Administered 2013-08-23 – 2013-08-24 (×7): 2 via ORAL
  Filled 2013-08-22 (×7): qty 2

## 2013-08-22 MED ORDER — MEPERIDINE HCL 25 MG/ML IJ SOLN: 6.2500 mg | INTRAMUSCULAR | Status: DC | PRN

## 2013-08-22 MED ORDER — VECURONIUM BROMIDE 10 MG IV SOLR
INTRAVENOUS | Status: AC
  Filled 2013-08-22: qty 10

## 2013-08-22 MED ORDER — DEXAMETHASONE SODIUM PHOSPHATE 4 MG/ML IJ SOLN
INTRAMUSCULAR | Status: DC | PRN
  Administered 2013-08-22: 8 mg via INTRAVENOUS

## 2013-08-22 MED ORDER — MIDAZOLAM HCL 2 MG/2ML IJ SOLN: 0.5000 mg | Freq: Once | INTRAMUSCULAR | Status: DC | PRN

## 2013-08-22 MED ORDER — HYDROMORPHONE HCL PF 1 MG/ML IJ SOLN
0.5000 mg | INTRAMUSCULAR | Status: DC | PRN
  Administered 2013-08-22 (×3): 1 mg via INTRAVENOUS
  Filled 2013-08-22 (×5): qty 1

## 2013-08-22 MED ORDER — NEOSTIGMINE METHYLSULFATE 10 MG/10ML IV SOLN
INTRAVENOUS | Status: AC
  Filled 2013-08-22: qty 2

## 2013-08-22 MED ORDER — FENTANYL CITRATE 0.05 MG/ML IJ SOLN
INTRAMUSCULAR | Status: DC | PRN
  Administered 2013-08-22: 50 ug via INTRAVENOUS
  Administered 2013-08-22: 100 ug via INTRAVENOUS
  Administered 2013-08-22 (×3): 50 ug via INTRAVENOUS

## 2013-08-22 MED ORDER — OXYCODONE HCL 5 MG/5ML PO SOLN: 5.0000 mg | Freq: Once | ORAL | Status: DC | PRN

## 2013-08-22 MED ORDER — OXYCODONE HCL 5 MG PO TABS: 5.0000 mg | ORAL_TABLET | Freq: Once | ORAL | Status: DC | PRN

## 2013-08-22 MED ORDER — STERILE WATER FOR INJECTION IJ SOLN
INTRAMUSCULAR | Status: AC
  Filled 2013-08-22: qty 10

## 2013-08-22 MED ORDER — METHOCARBAMOL 1000 MG/10ML IJ SOLN: 500.0000 mg | Freq: Four times a day (QID) | INTRAVENOUS | Status: DC | PRN

## 2013-08-22 MED ORDER — PROPOFOL 10 MG/ML IV BOLUS
INTRAVENOUS | Status: AC
  Filled 2013-08-22: qty 20

## 2013-08-22 MED ORDER — NEOSTIGMINE METHYLSULFATE 10 MG/10ML IV SOLN
INTRAVENOUS | Status: DC | PRN
  Administered 2013-08-22: 2.5 mg via INTRAVENOUS

## 2013-08-22 MED ORDER — THYROID 60 MG PO TABS
90.0000 mg | ORAL_TABLET | Freq: Every day | ORAL | Status: DC
  Administered 2013-08-23: 90 mg via ORAL
  Filled 2013-08-22 (×2): qty 1

## 2013-08-22 MED ORDER — FLUTICASONE PROPIONATE 50 MCG/ACT NA SUSP
2.0000 | Freq: Every day | NASAL | Status: DC
  Administered 2013-08-22 – 2013-08-24 (×3): 2 via NASAL
  Filled 2013-08-22: qty 16

## 2013-08-22 MED ORDER — DOCUSATE SODIUM 100 MG PO CAPS
100.0000 mg | ORAL_CAPSULE | Freq: Two times a day (BID) | ORAL | Status: DC
  Administered 2013-08-23 – 2013-08-24 (×4): 100 mg via ORAL
  Filled 2013-08-22 (×5): qty 1

## 2013-08-22 MED ORDER — SODIUM CHLORIDE 0.9 % IV BOLUS (SEPSIS)
1000.0000 mL | Freq: Once | INTRAVENOUS | Status: AC
  Administered 2013-08-22: 1000 mL via INTRAVENOUS

## 2013-08-22 MED ORDER — PROPOFOL 10 MG/ML IV BOLUS
INTRAVENOUS | Status: DC | PRN
  Administered 2013-08-22: 150 mg via INTRAVENOUS

## 2013-08-22 MED ORDER — SUCCINYLCHOLINE CHLORIDE 20 MG/ML IJ SOLN
INTRAMUSCULAR | Status: DC | PRN
  Administered 2013-08-22: 100 mg via INTRAVENOUS

## 2013-08-22 MED ORDER — HYDROMORPHONE HCL PF 1 MG/ML IJ SOLN
0.2500 mg | INTRAMUSCULAR | Status: DC | PRN
  Administered 2013-08-22 (×3): 0.5 mg via INTRAVENOUS

## 2013-08-22 MED ORDER — CEFAZOLIN SODIUM-DEXTROSE 2-3 GM-% IV SOLR
2.0000 g | INTRAVENOUS | Status: AC
Start: 2013-08-23 — End: 2013-08-23
  Administered 2013-08-23: 2 g via INTRAVENOUS
  Filled 2013-08-22 (×2): qty 50

## 2013-08-22 MED ORDER — HYDROCODONE-ACETAMINOPHEN 7.5-325 MG PO TABS: 1.0000 | ORAL_TABLET | ORAL | Status: DC | PRN

## 2013-08-22 MED ORDER — DIPHENHYDRAMINE HCL 25 MG PO CAPS
25.0000 mg | ORAL_CAPSULE | Freq: Four times a day (QID) | ORAL | Status: DC | PRN
  Administered 2013-08-22 – 2013-08-24 (×8): 25 mg via ORAL
  Filled 2013-08-22 (×8): qty 1

## 2013-08-22 MED ORDER — ROCURONIUM BROMIDE 50 MG/5ML IV SOLN
INTRAVENOUS | Status: AC
  Filled 2013-08-22: qty 1

## 2013-08-22 MED ORDER — ENOXAPARIN SODIUM 40 MG/0.4ML ~~LOC~~ SOLN
40.0000 mg | SUBCUTANEOUS | Status: DC
  Administered 2013-08-23 – 2013-08-24 (×2): 40 mg via SUBCUTANEOUS
  Filled 2013-08-22 (×3): qty 0.4

## 2013-08-22 MED ORDER — HYDROMORPHONE HCL PF 1 MG/ML IJ SOLN
1.0000 mg | Freq: Four times a day (QID) | INTRAMUSCULAR | Status: DC | PRN
  Administered 2013-08-22: 1 mg via INTRAVENOUS
  Filled 2013-08-22: qty 1

## 2013-08-22 MED ORDER — MIDAZOLAM HCL 5 MG/5ML IJ SOLN
INTRAMUSCULAR | Status: DC | PRN
  Administered 2013-08-22: 2 mg via INTRAVENOUS

## 2013-08-22 MED ORDER — OXYCODONE-ACETAMINOPHEN 5-325 MG PO TABS
1.0000 | ORAL_TABLET | ORAL | Status: DC | PRN
  Administered 2013-08-22: 2 via ORAL
  Filled 2013-08-22: qty 2

## 2013-08-22 MED ORDER — POTASSIUM CHLORIDE IN NACL 20-0.9 MEQ/L-% IV SOLN
INTRAVENOUS | Status: DC
  Filled 2013-08-22 (×4): qty 1000

## 2013-08-22 MED ORDER — METOCLOPRAMIDE HCL 10 MG PO TABS: 5.0000 mg | ORAL_TABLET | Freq: Three times a day (TID) | ORAL | Status: DC | PRN

## 2013-08-22 MED ORDER — BUPIVACAINE-EPINEPHRINE (PF) 0.5% -1:200000 IJ SOLN
INTRAMUSCULAR | Status: DC | PRN
  Administered 2013-08-22: 30 mL via PERINEURAL

## 2013-08-22 MED ORDER — CEFAZOLIN SODIUM-DEXTROSE 2-3 GM-% IV SOLR
INTRAVENOUS | Status: DC | PRN
  Administered 2013-08-22: 2 g via INTRAVENOUS

## 2013-08-22 MED ORDER — MIDAZOLAM HCL 2 MG/2ML IJ SOLN
INTRAMUSCULAR | Status: AC
  Filled 2013-08-22: qty 2

## 2013-08-22 MED ORDER — ONDANSETRON HCL 4 MG PO TABS: 4.0000 mg | ORAL_TABLET | Freq: Four times a day (QID) | ORAL | Status: DC | PRN

## 2013-08-22 MED ORDER — PROMETHAZINE HCL 25 MG/ML IJ SOLN: 6.2500 mg | INTRAMUSCULAR | Status: DC | PRN

## 2013-08-22 MED ORDER — SCOPOLAMINE 1 MG/3DAYS TD PT72
MEDICATED_PATCH | TRANSDERMAL | Status: DC | PRN
  Administered 2013-08-22: 1 via TRANSDERMAL

## 2013-08-22 MED ORDER — CEFAZOLIN SODIUM-DEXTROSE 2-3 GM-% IV SOLR
INTRAVENOUS | Status: AC
  Filled 2013-08-22: qty 50

## 2013-08-22 MED ORDER — ARTIFICIAL TEARS OP OINT
TOPICAL_OINTMENT | OPHTHALMIC | Status: DC | PRN
  Administered 2013-08-22: 1 via OPHTHALMIC

## 2013-08-22 SURGICAL SUPPLY — 67 items
BANDAGE ELASTIC 4 VELCRO ST LF (GAUZE/BANDAGES/DRESSINGS) IMPLANT
BANDAGE ELASTIC 6 VELCRO ST LF (GAUZE/BANDAGES/DRESSINGS) ×3 IMPLANT
BANDAGE ESMARK 6X9 LF (GAUZE/BANDAGES/DRESSINGS) ×1 IMPLANT
BENZOIN TINCTURE PRP APPL 2/3 (GAUZE/BANDAGES/DRESSINGS) ×3 IMPLANT
BIT DRILL 2.4 AO COUPLING CANN (BIT) ×3 IMPLANT
BLADE SURG ROTATE 9660 (MISCELLANEOUS) IMPLANT
BNDG ESMARK 6X9 LF (GAUZE/BANDAGES/DRESSINGS) ×3
CANISTER SUCTION 2500CC (MISCELLANEOUS) ×3 IMPLANT
CLOSURE STERI-STRIP 1/2X4 (GAUZE/BANDAGES/DRESSINGS) ×2
CLSR STERI-STRIP ANTIMIC 1/2X4 (GAUZE/BANDAGES/DRESSINGS) ×4 IMPLANT
COVER SURGICAL LIGHT HANDLE (MISCELLANEOUS) ×3 IMPLANT
CUFF TOURNIQUET SINGLE 34IN LL (TOURNIQUET CUFF) ×3 IMPLANT
CUFF TOURNIQUET SINGLE 44IN (TOURNIQUET CUFF) IMPLANT
DRAPE C-ARM 42X72 X-RAY (DRAPES) ×3 IMPLANT
DRAPE C-ARMOR (DRAPES) ×3 IMPLANT
DRAPE U-SHAPE 47X51 STRL (DRAPES) ×3 IMPLANT
DRSG ADAPTIC 3X8 NADH LF (GAUZE/BANDAGES/DRESSINGS) ×3 IMPLANT
DRSG EMULSION OIL 3X3 NADH (GAUZE/BANDAGES/DRESSINGS) IMPLANT
DRSG PAD ABDOMINAL 8X10 ST (GAUZE/BANDAGES/DRESSINGS) ×3 IMPLANT
ELECT REM PT RETURN 9FT ADLT (ELECTROSURGICAL) ×3
ELECTRODE REM PT RTRN 9FT ADLT (ELECTROSURGICAL) ×1 IMPLANT
GLOVE BIOGEL PI IND STRL 7.5 (GLOVE) ×1 IMPLANT
GLOVE BIOGEL PI IND STRL 8 (GLOVE) ×1 IMPLANT
GLOVE BIOGEL PI INDICATOR 7.5 (GLOVE) ×2
GLOVE BIOGEL PI INDICATOR 8 (GLOVE) ×2
GLOVE ECLIPSE 7.0 STRL STRAW (GLOVE) IMPLANT
GLOVE ORTHO TXT STRL SZ7.5 (GLOVE) ×3 IMPLANT
GOWN STRL REUS W/ TWL LRG LVL3 (GOWN DISPOSABLE) IMPLANT
GOWN STRL REUS W/ TWL XL LVL3 (GOWN DISPOSABLE) ×2 IMPLANT
GOWN STRL REUS W/TWL LRG LVL3 (GOWN DISPOSABLE)
GOWN STRL REUS W/TWL XL LVL3 (GOWN DISPOSABLE) ×4
K-WIRE TROC 1.25X150 (WIRE) ×9
KIT BASIN OR (CUSTOM PROCEDURE TRAY) ×3 IMPLANT
KIT ROOM TURNOVER OR (KITS) ×3 IMPLANT
KWIRE TROC 1.25X150 (WIRE) ×3 IMPLANT
NEEDLE 22X1 1/2 (OR ONLY) (NEEDLE) IMPLANT
NS IRRIG 1000ML POUR BTL (IV SOLUTION) ×3 IMPLANT
PACK ORTHO EXTREMITY (CUSTOM PROCEDURE TRAY) ×3 IMPLANT
PAD ARMBOARD 7.5X6 YLW CONV (MISCELLANEOUS) ×6 IMPLANT
PAD CAST 4YDX4 CTTN HI CHSV (CAST SUPPLIES) IMPLANT
PADDING CAST COTTON 4X4 STRL (CAST SUPPLIES)
PADDING CAST COTTON 6X4 STRL (CAST SUPPLIES) ×6 IMPLANT
SCREW CANN 4.0X38MM (Screw) ×3 IMPLANT
SCREW CANN PT 4.0X34 (Screw) ×3 IMPLANT
SCREW CANNULATED 4.0X32MM (Screw) ×3 IMPLANT
SCREW CANNULATED PT 4.0X40 (Screw) ×3 IMPLANT
SCREW CANNULATED PT 4.0X42 (Screw) ×3 IMPLANT
SPONGE GAUZE 4X4 12PLY STER LF (GAUZE/BANDAGES/DRESSINGS) ×3 IMPLANT
SPONGE LAP 4X18 X RAY DECT (DISPOSABLE) IMPLANT
STAPLER VISISTAT 35W (STAPLE) IMPLANT
SUCTION FRAZIER TIP 10 FR DISP (SUCTIONS) ×3 IMPLANT
SUT VIC AB 0 CT1 27 (SUTURE) ×2
SUT VIC AB 0 CT1 27XBRD ANBCTR (SUTURE) ×1 IMPLANT
SUT VIC AB 1 CT1 27 (SUTURE) ×2
SUT VIC AB 1 CT1 27XBRD ANBCTR (SUTURE) ×1 IMPLANT
SUT VIC AB 2-0 CT1 27 (SUTURE) ×2
SUT VIC AB 2-0 CT1 TAPERPNT 27 (SUTURE) ×1 IMPLANT
SUT VIC AB 3-0 PS2 18 (SUTURE) ×2
SUT VIC AB 3-0 PS2 18XBRD (SUTURE) ×1 IMPLANT
SYR CONTROL 10ML LL (SYRINGE) IMPLANT
TOWEL OR 17X24 6PK STRL BLUE (TOWEL DISPOSABLE) ×3 IMPLANT
TOWEL OR 17X26 10 PK STRL BLUE (TOWEL DISPOSABLE) ×3 IMPLANT
TUBE CONNECTING 12'X1/4 (SUCTIONS) ×1
TUBE CONNECTING 12X1/4 (SUCTIONS) ×2 IMPLANT
UNDERPAD 30X30 INCONTINENT (UNDERPADS AND DIAPERS) ×3 IMPLANT
WATER STERILE IRR 1000ML POUR (IV SOLUTION) IMPLANT
YANKAUER SUCT BULB TIP NO VENT (SUCTIONS) IMPLANT

## 2013-08-22 NOTE — Anesthesia Postprocedure Evaluation (Signed)
  Anesthesia Post-op Note  Patient: Sarah KaufmannKimberly C Etzler  Procedure(s) Performed: Procedure(s): OPEN REDUCTION INTERNAL (ORIF) FIXATION PATELLA (Right)  Patient Location: PACU  Anesthesia Type:GA combined with regional for post-op pain  Level of Consciousness: awake, alert , oriented and patient cooperative  Airway and Oxygen Therapy: Patient Spontanous Breathing and Patient connected to nasal cannula oxygen  Post-op Pain: none  Post-op Assessment: Post-op Vital signs reviewed, Patient's Cardiovascular Status Stable, Respiratory Function Stable, Patent Airway, No signs of Nausea or vomiting and Pain level controlled  Post-op Vital Signs: Reviewed and stable  Last Vitals:  Filed Vitals:   08/22/13 2100  BP: 112/51  Pulse: 82  Temp:   Resp: 20    Complications: No apparent anesthesia complications

## 2013-08-22 NOTE — ED Notes (Signed)
Transporting patient to new room assignment. 

## 2013-08-22 NOTE — Anesthesia Preprocedure Evaluation (Signed)
Anesthesia Evaluation  Patient identified by MRN, date of birth, ID band Patient awake    Reviewed: Allergy & Precautions, H&P , NPO status , Patient's Chart, lab work & pertinent test results  History of Anesthesia Complications Negative for: history of anesthetic complications  Airway Mallampati: I TM Distance: >3 FB Neck ROM: Full    Dental  (+) Dental Advisory Given, Caps   Pulmonary Current Smoker,  breath sounds clear to auscultation  Pulmonary exam normal       Cardiovascular negative cardio ROS  Rhythm:Regular Rate:Normal     Neuro/Psych negative neurological ROS     GI/Hepatic Neg liver ROS, GERD-  Poorly Controlled,  Endo/Other  Hypothyroidism Morbid obesity  Renal/GU negative Renal ROS     Musculoskeletal   Abdominal (+) + obese,   Peds  Hematology negative hematology ROS (+)   Anesthesia Other Findings   Reproductive/Obstetrics LMP presently                           Anesthesia Physical Anesthesia Plan  ASA: II  Anesthesia Plan: General   Post-op Pain Management:    Induction: Intravenous  Airway Management Planned: Oral ETT  Additional Equipment:   Intra-op Plan:   Post-operative Plan: Extubation in OR  Informed Consent: I have reviewed the patients History and Physical, chart, labs and discussed the procedure including the risks, benefits and alternatives for the proposed anesthesia with the patient or authorized representative who has indicated his/her understanding and acceptance.   Dental advisory given  Plan Discussed with: CRNA and Surgeon  Anesthesia Plan Comments: (Plan routine monitors, GETA with femoral neve block for post op analgesia)        Anesthesia Quick Evaluation
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
## 2013-08-22 NOTE — Brief Op Note (Signed)
08/21/2013 - 08/22/2013  8:48 PM  PATIENT:  Sarah Vaughan  43 y.o. female  PRE-OPERATIVE DIAGNOSIS:  right patella fracture  POST-OPERATIVE DIAGNOSIS:  right patella fracture  PROCEDURE:  Procedure(s): OPEN REDUCTION INTERNAL (ORIF) FIXATION PATELLA (Right)  SURGEON:  Surgeon(s) and Role:    * Eldred MangesMark C Harvir Patry, MD - Primary  PHYSICIAN ASSISTANT:   ASSISTANTS: none   ANESTHESIA:   regional and general  EBL:  Total I/O In: 1000 [I.V.:1000] Out: -   BLOOD ADMINISTERED:none  DRAINS: none   LOCAL MEDICATIONS USED:  NONE  SPECIMEN:  No Specimen  DISPOSITION OF SPECIMEN:  N/A  COUNTS:  YES  TOURNIQUET:   Total Tourniquet Time Documented: Thigh (Right) - 90 minutes Total: Thigh (Right) - 90 minutes   DICTATION: .Other Dictation: Dictation Number [id number]  PLAN OF CARE: already inpt  PATIENT DISPOSITION:  PACU - hemodynamically stable.   Delay start of Pharmacological VTE agent (>24hrs) due to surgical blood loss or risk of bleeding: yes

## 2013-08-22 NOTE — Interval H&P Note (Signed)
History and Physical Interval Note:  08/22/2013 5:29 PM  Sarah Vaughan  has presented today for surgery, with the diagnosis of right patella fracture  The various methods of treatment have been discussed with the patient and family. After consideration of risks, benefits and other options for treatment, the patient has consented to  Procedure(s): OPEN REDUCTION INTERNAL (ORIF) FIXATION PATELLA (Right) as a surgical intervention .  The patient's history has been reviewed, patient examined, no change in status, stable for surgery.  I have reviewed the patient's chart and labs.  Questions were answered to the patient's satisfaction.     Rodney Wigger C

## 2013-08-22 NOTE — Anesthesia Procedure Notes (Addendum)
Anesthesia Regional Block:  Femoral nerve block  Pre-Anesthetic Checklist: ,, timeout performed, Correct Patient, Correct Site, Correct Laterality, Correct Procedure, Correct Position, site marked, Risks and benefits discussed,  Surgical consent,  Pre-op evaluation,  At surgeon's request and post-op pain management  Laterality: Right and Lower  Prep: chloraprep       Needles:  Injection technique: Single-shot  Needle Type: Echogenic Stimulator Needle     Needle Length: 4cm 4 cm Needle Gauge: 22 and 22 G    Additional Needles:  Procedures: nerve stimulator Femoral nerve block  Nerve Stimulator or Paresthesia:  Response: quad/patella twitch, 0.44 mA, 0.1 ms,   Additional Responses:   Narrative:  Start time: 08/22/2013 6:51 PM End time: 08/22/2013 6:56 PM Injection made incrementally with aspirations every 5 mL.  Performed by: Personally  Anesthesiologist: Sandford Craze Jackson, MD  Additional Notes: Pt identified in Holding room.  Monitors applied. Working IV access confirmed. Sterile prep R groin.  #22ga PNS to patella twitch at 0.7244mA threshold.  30cc 0.5% Bupivacaine with 1:200k epi injected incrementally after negative test dose.  Patient asymptomatic, VSS, no heme aspirated, tolerated well.  Sandford Craze Jackson, MD   Procedure Name: Intubation Date/Time: 08/22/2013 6:16 PM Performed by: Wray KearnsFOLEY, Saskia Simerson A Pre-anesthesia Checklist: Patient identified, Emergency Drugs available, Timeout performed, Suction available and Patient being monitored Patient Re-evaluated:Patient Re-evaluated prior to inductionOxygen Delivery Method: Circle system utilized Preoxygenation: Pre-oxygenation with 100% oxygen Intubation Type: IV induction, Rapid sequence and Cricoid Pressure applied Laryngoscope Size: Mac and 4 Grade View: Grade I Tube type: Oral Tube size: 7.5 mm Number of attempts: 1 Airway Equipment and Method: Stylet Placement Confirmation: ETT inserted through vocal cords under direct vision,  breath  sounds checked- equal and bilateral and positive ETCO2 Secured at: 22 cm Tube secured with: Tape Dental Injury: Teeth and Oropharynx as per pre-operative assessment

## 2013-08-22 NOTE — Transfer of Care (Signed)
Immediate Anesthesia Transfer of Care Note  Patient: Sarah Vaughan  Procedure(s) Performed: Procedure(s): OPEN REDUCTION INTERNAL (ORIF) FIXATION PATELLA (Right)  Patient Location: PACU  Anesthesia Type:GA combined with regional for post-op pain  Level of Consciousness: oriented, sedated, patient cooperative and responds to stimulation  Airway & Oxygen Therapy: Patient Spontanous Breathing and Patient connected to nasal cannula oxygen  Post-op Assessment: Report given to PACU RN, Post -op Vital signs reviewed and stable, Patient moving all extremities and Patient moving all extremities X 4  Post vital signs: Reviewed and stable  Complications: No apparent anesthesia complications

## 2013-08-23 LAB — CBC
HCT: 36.8 % (ref 36.0–46.0)
HEMOGLOBIN: 12.2 g/dL (ref 12.0–15.0)
MCH: 32.9 pg (ref 26.0–34.0)
MCHC: 33.2 g/dL (ref 30.0–36.0)
MCV: 99.2 fL (ref 78.0–100.0)
PLATELETS: 234 10*3/uL (ref 150–400)
RBC: 3.71 MIL/uL — AB (ref 3.87–5.11)
RDW: 12.5 % (ref 11.5–15.5)
WBC: 9 10*3/uL (ref 4.0–10.5)

## 2013-08-23 LAB — CREATININE, SERUM: CREATININE: 0.6 mg/dL (ref 0.50–1.10)

## 2013-08-23 NOTE — Evaluation (Signed)
I have read and agree with this note.   Time in/out: [id number] Total time: 25 minutes (Ev, 1SC)  Ignacia Palmaathy Tavone Caesar, OTR/L [phone]
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
## 2013-08-23 NOTE — Progress Notes (Signed)
Subjective: 1 Day Post-Op Procedure(s) (LRB): OPEN REDUCTION INTERNAL (ORIF) FIXATION PATELLA (Right) Patient reports pain as mild.    Objective: Vital signs in last 24 hours: Temp:  [98.2 F (36.8 C)-98.9 F (37.2 C)] 98.9 F (37.2 C) (08/03 1400) Pulse Rate:  [61-76] 76 (08/03 1400) Resp:  [15-16] 15 (08/03 1400) BP: (96-117)/(48-52) 117/52 mmHg (08/03 1400) SpO2:  [90 %-98 %] 98 % (08/03 1400)  Intake/Output from previous day: 08/02 0701 - 08/03 0700 In: 1970 [P.O.:320; I.V.:1650] Out: 1400 [Urine:1400] Intake/Output this shift:     Recent Labs  08/21/13 2020 08/22/13 2337  HGB 12.3 12.2    Recent Labs  08/21/13 2020 08/22/13 2337  WBC 13.9* 9.0  RBC 3.76* 3.71*  HCT 36.1 36.8  PLT 262 234    Recent Labs  08/21/13 2020 08/22/13 2337  NA 139  --   K 3.8  --   CL 103  --   CO2 21  --   BUN 15  --   CREATININE 0.55 0.60  GLUCOSE 109*  --   CALCIUM 9.0  --    No results found for this basename: LABPT, INR,  in the last 72 hours  Neurologically intact  Assessment/Plan: 1 Day Post-Op Procedure(s) (LRB): OPEN REDUCTION INTERNAL (ORIF) FIXATION PATELLA (Right) Up with therapy   Walked down the hall with therapy. Plan Home in AM    Saline lock IV  Ailis Rigaud C 08/23/2013, 9:37 PM
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
## 2013-08-23 NOTE — Evaluation (Signed)
Physical Therapy Evaluation Patient Details Name: Sarah Vaughan MRN: [id number] DOB: 08/14/1970 Today's Date: 08/23/2013   History of Present Illness  43 y.o. female s/p ORIF of right patella.  Clinical Impression  Patient is seen following the above procedure and presents with functional limitations due to the deficits listed below (see PT Problem List). Ambulating up to 80 feet with min guard assist while using a rolling walker. Patient will benefit from skilled PT to increase their independence and safety with mobility to allow discharge to the venue listed below.      Follow Up Recommendations Outpatient PT;Supervision for mobility/OOB    Equipment Recommendations  Rolling walker with 5" wheels;3in1 (PT)    Recommendations for Other Services OT consult     Precautions / Restrictions Precautions Precautions: Knee Precaution Comments: Reviewed knee precautions Required Braces or Orthoses: Knee Immobilizer - Right Knee Immobilizer - Right: Other (comment) (In room - no orders - worn during therapy) Restrictions Weight Bearing Restrictions: Yes RLE Weight Bearing: Non weight bearing      Mobility  Bed Mobility Overal bed mobility: Needs Assistance Bed Mobility: Supine to Sit     Supine to sit: Min assist     General bed mobility comments: for RLE only  Transfers Overall transfer level: Needs assistance Equipment used: Rolling walker (2 wheeled) Transfers: Sit to/from Stand Sit to Stand: Min guard         General transfer comment: Supervision for safety. Safely demonstrates correct hand placement with sit>stand. Needs reminded when sitting. Performed x2 from recliner  Ambulation/Gait Ambulation/Gait assistance: Min guard Ambulation Distance (Feet): 80 Feet Assistive device: Rolling walker (2 wheeled) Gait Pattern/deviations:  ("hop-to" pattern)   Gait velocity interpretation: Below normal speed for age/gender General Gait Details: Educated on safe DME use.  Pt prefers to use RW, declines training with crutches. Demonstrating good stability and maintains NWB on RLE. VC for walker placement prior to taking step.  Stairs            Wheelchair Mobility    Modified Rankin (Stroke Patients Only)       Balance Overall balance assessment: Needs assistance Sitting-balance support: No upper extremity supported Sitting balance-Leahy Scale: Good     Standing balance support: Single extremity supported;During functional activity Standing balance-Leahy Scale: Poor                               Pertinent Vitals/Pain Pt reports pain as "low" no numerical value given Patient repositioned in chair for comfort.     Home Living Family/patient expects to be discharged to:: Private residence Living Arrangements: Spouse/significant other;Children Available Help at Discharge: Family;Available PRN/intermittently Type of Home: House Home Access: Level entry Entrance Stairs-Rails: None Entrance Stairs-Number of Steps: 1 Home Layout: Two level;Able to live on main level with bedroom/bathroom Home Equipment: Shower seat - built in;Hand held shower head;Grab bars - tub/shower;Grab bars - toilet      Prior Function Level of Independence: Independent               Hand Dominance   Dominant Hand: Right    Extremity/Trunk Assessment   Upper Extremity Assessment: Overall WFL for tasks assessed           Lower Extremity Assessment: Defer to PT evaluation RLE Deficits / Details: Decreased strength and ROM as expected post-op. Limited assessment due to pain and immobility       Communication   Communication: No difficulties  Cognition Arousal/Alertness: Awake/alert Behavior During Therapy: WFL for tasks assessed/performed Overall Cognitive Status: Within Functional Limits for tasks assessed                      General Comments General comments (skin integrity, edema, etc.): Educated on safe positioning for  optimal healing.    Exercises General Exercises - Lower Extremity Ankle Circles/Pumps: AROM;Both;10 reps;Seated Quad Sets: AROM;Right;10 reps;Seated      Assessment/Plan    PT Assessment Patient needs continued PT services  PT Diagnosis Difficulty walking;Abnormality of gait;Acute pain   PT Problem List Decreased strength;Decreased range of motion;Decreased activity tolerance;Decreased balance;Decreased mobility;Decreased knowledge of use of DME;Pain  PT Treatment Interventions DME instruction;Gait training;Stair training;Functional mobility training;Therapeutic activities;Therapeutic exercise;Neuromuscular re-education;Balance training;Patient/family education;Modalities   PT Goals (Current goals can be found in the Care Plan section) Acute Rehab PT Goals Patient Stated Goal: to get home to kids PT Goal Formulation: With patient Time For Goal Achievement: 08/30/13 Potential to Achieve Goals: Good    Frequency Min 5X/week   Barriers to discharge        Co-evaluation               End of Session Equipment Utilized During Treatment: Right knee immobilizer Activity Tolerance: Patient tolerated treatment well Patient left: in chair;with call bell/phone within reach Nurse Communication: Mobility status         Time: 1140-1210 PT Time Calculation (min): 30 min   Charges:   PT Evaluation $Initial PT Evaluation Tier I: 1 Procedure PT Treatments $Gait Training: 8-22 mins   PT G Codes:        Sarah Vaughan, Sarah Vaughan [phone]   Sarah Vaughan 08/23/2013, 1:28 PM

## 2013-08-24 MED ORDER — ASPIRIN 325 MG PO TABS: 325.0000 mg | ORAL_TABLET | Freq: Every day | ORAL | Status: AC

## 2013-08-24 MED ORDER — OXYCODONE-ACETAMINOPHEN 5-325 MG PO TABS: 1.0000 | ORAL_TABLET | ORAL | Status: AC | PRN

## 2013-08-24 NOTE — Discharge Planning (Signed)
Discharge instructions given. Walker received for home. Patient stable, aware of follow-up care. Sherlyn Leeseleha, Rayelle Armor S, RN

## 2013-08-24 NOTE — Progress Notes (Signed)
Physical Therapy Treatment Patient Details Name: Sarah KaufmannKimberly C Balis MRN: [id number] DOB: 04/17/1970 Today's Date: 08/24/2013    History of Present Illness 43 y.o. female s/p ORIF of right patella.    PT Comments    Patient is progressing well towards physical therapy goals, ambulating up to 140 feet at a supervision level with a rolling walker. Safely completed stair training and all pertinent education has been reviewed. She has no further questions or concerns regarding her mobility at this time. Feel patient is adequate for d/c from a mobility standpoint.    Follow Up Recommendations  Outpatient PT;Supervision for mobility/OOB     Equipment Recommendations  Rolling walker with 5" wheels;3in1 (PT)    Recommendations for Other Services OT consult     Precautions / Restrictions Precautions Precautions: Knee Precaution Comments: Reviewed knee precautions Required Braces or Orthoses: Knee Immobilizer - Right Knee Immobilizer - Right: Other (comment) (In room - no orders - worn during therapy) Restrictions Weight Bearing Restrictions: Yes RLE Weight Bearing: Non weight bearing    Mobility  Bed Mobility                  Transfers Overall transfer level: Needs assistance Equipment used: Rolling walker (2 wheeled) Transfers: Sit to/from Stand Sit to Stand: Supervision         General transfer comment: Supervision for safety. Performed from recliner with no physical assist and good stability upon standing.  Ambulation/Gait Ambulation/Gait assistance: Supervision Ambulation Distance (Feet): 140 Feet Assistive device: Rolling walker (2 wheeled) Gait Pattern/deviations:  ("hop-to" pattern )   Gait velocity interpretation: Below normal speed for age/gender General Gait Details: Improved control of RW today. no loss of balance. Safely maintains NWB on RLE. VC for forward gaze and to take her time with each step. Required one standing rest break during  bout.   Stairs Stairs: Yes Stairs assistance: Min guard Stair Management: No rails;Step to pattern;Backwards;With walker Number of Stairs: 1 (x2) General stair comments: Educated on stair navigation and demonstrated to pt prior to having her practice. VC for UE use to conserve energy and to prevent "jumping" onto step. Pt performed this task safely on the second trial and she has no further concerns with this task.  Wheelchair Mobility    Modified Rankin (Stroke Patients Only)       Balance                                    Cognition Arousal/Alertness: Awake/alert Behavior During Therapy: WFL for tasks assessed/performed Overall Cognitive Status: Within Functional Limits for tasks assessed                      Exercises General Exercises - Lower Extremity Ankle Circles/Pumps: AROM;Both;10 reps;Seated    General Comments        Pertinent Vitals/Pain Pt reports pain has increased since yesterday. No numerical value given Patient repositioned in chair for comfort.     Home Living                      Prior Function            PT Goals (current goals can now be found in the care plan section) Acute Rehab PT Goals PT Goal Formulation: With patient Time For Goal Achievement: 08/30/13 Potential to Achieve Goals: Good Progress towards PT goals: Progressing toward goals    Frequency  Min 5X/week    PT Plan Current plan remains appropriate    Co-evaluation             End of Session Equipment Utilized During Treatment: Right knee immobilizer Activity Tolerance: Patient tolerated treatment well Patient left: in chair;with call bell/phone within reach     Time: 0909-0925 PT Time Calculation (min): 16 min  Charges:  $Gait Training: 8-22 mins                    G Codes:      Charlsie Merles, [place] [phone]  Berton Mount 08/24/2013, 10:13 AM

## 2013-08-24 NOTE — Progress Notes (Signed)
Utilization review completed.  

## 2013-08-24 NOTE — Progress Notes (Signed)
I have read and agree with this note.   Time in/out:830-851 Total time: 21 minutes (1 [place])  Ignacia Palmaathy Brenley Priore, OTR/L [phone]

## 2013-08-24 NOTE — Progress Notes (Signed)
Occupational Therapy Treatment and Discharge Patient Details Name: Sarah Vaughan MRN: [id number] DOB: 09/14/1970 Today's Date: 08/24/2013    History of present illness 43 y.o. female s/p ORIF of right patella.   OT comments  Focus of session was on practicing LB bathing/dressing with AE and reviewing self care compensatory strategies. All education was completed and pt feels confident in self care tasks, therefore no further OT is needed. We will sign off.  Follow Up Recommendations  No OT follow up;Supervision - Intermittent    Equipment Recommendations  None recommended by OT    Recommendations for Other Services      Precautions / Restrictions Precautions Precautions: Knee Precaution Comments: Reviewed knee precautions Required Braces or Orthoses: Knee Immobilizer - Right Knee Immobilizer - Right: On at all times Restrictions Weight Bearing Restrictions: Yes RLE Weight Bearing: Non weight bearing       Mobility Bed Mobility               General bed mobility comments: Pt up in chair upon OT tx.  Transfers Overall transfer level: Needs assistance Equipment used: Rolling walker (2 wheeled) Transfers: Sit to/from Stand Sit to Stand: Supervision         General transfer comment: Pt remained seated in recliner throughout tx.    Balance Overall balance assessment: Needs assistance   Sitting balance-Leahy Scale: Good                             ADL Overall ADL's : Needs assistance/impaired             Lower Body Bathing: Sitting;With adaptive equipment;Supervison/ safety       Lower Body Dressing: Sitting;With adaptive equipment;Supervision/safety                 General ADL Comments: Educated and had pt practice LB bathing/dressing using reacher and long handled sponge. Instructed pt where she could purchase those items. Educated pt on proper shower transfer technique and how to use LLE to assist RLE off the bed.                 Cognition   Behavior During Therapy: WFL for tasks assessed/performed Overall Cognitive Status: Within Functional Limits for tasks assessed                         Exercises General Exercises - Lower Extremity Ankle Circles/Pumps: AROM;Both;10 reps;Seated    Pertinent Vitals/ Pain       No c/o pain during tx.         Frequency Min 2X/week     Progress Toward Goals  OT Goals(current goals can now be found in the care plan section)  Progress towards OT goals:  (All education completed)  Acute Rehab OT Goals Patient Stated Goal: to get home to kids OT Goal Formulation: With patient Time For Goal Achievement: 08/30/13 Potential to Achieve Goals: Good  Plan Discharge plan remains appropriate       End of Session Equipment Utilized During Treatment: Right knee immobilizer   Activity Tolerance Patient tolerated treatment well   Patient Left in chair;with call bell/phone within reach           Time:  -     Charges:    Sarah Vaughan, Sarah Vaughan 08/24/2013, 12:55 PM

## 2013-08-24 NOTE — Progress Notes (Signed)
Subjective: 2 Days Post-Op Procedure(s) (LRB): OPEN REDUCTION INTERNAL (ORIF) FIXATION PATELLA (Right) Patient reports pain as moderate.    Objective: Vital signs in last 24 hours: Temp:  [98.1 F (36.7 C)-98.9 F (37.2 C)] 98.1 F (36.7 C) (08/04 0531) Pulse Rate:  [60-76] 60 (08/04 0531) Resp:  [15-16] 16 (08/04 0531) BP: (101-117)/(48-52) 101/48 mmHg (08/04 0531) SpO2:  [95 %-98 %] 95 % (08/04 0531)  Intake/Output from previous day: 08/03 0701 - 08/04 0700 In: 840 [P.O.:840] Out: -  Intake/Output this shift:     Recent Labs  08/21/13 2020 08/22/13 2337  HGB 12.3 12.2    Recent Labs  08/21/13 2020 08/22/13 2337  WBC 13.9* 9.0  RBC 3.76* 3.71*  HCT 36.1 36.8  PLT 262 234    Recent Labs  08/21/13 2020 08/22/13 2337  NA 139  --   K 3.8  --   CL 103  --   CO2 21  --   BUN 15  --   CREATININE 0.55 0.60  GLUCOSE 109*  --   CALCIUM 9.0  --    No results found for this basename: LABPT, INR,  in the last 72 hours  Neurologically intact  Assessment/Plan: 2 Days Post-Op Procedure(s) (LRB): OPEN REDUCTION INTERNAL (ORIF) FIXATION PATELLA (Right) Up with therapy   Home today. Office one week.   Melania Kirks C 08/24/2013, 7:31 AM

## 2013-08-24 NOTE — Discharge Instructions (Signed)

## 2013-08-24 NOTE — Care Management Note (Signed)
CARE MANAGEMENT NOTE 08/24/2013  Patient:  Sarah Vaughan,Sarah Vaughan   Account Number:  [id number]  Date Initiated:  08/24/2013  Documentation initiated by:  Vance PeperBRADY,Sharmain Lastra  Subjective/Objective Assessment:   43 yr old female s/p right Patella ORIF     Action/Plan:   Patient has no home health needs. Discharging with family.   Anticipated DC Date:  08/24/2013   Anticipated DC Plan:  HOME/SELF CARE      DC Planning Services  CM consult      PAC Choice  DURABLE MEDICAL EQUIPMENT   Choice offered to / List presented to:     DME arranged  [facility]      [facility] agency  [facility]        [facility]  [facility]   Status of service:  Completed, signed off Medicare Important Message given?   (If response is "NO", the following Medicare IM given date fields will be blank) Date Medicare IM given:   Medicare IM given by:   Date Additional Medicare IM given:   Additional Medicare IM given by:    Discharge Disposition:  HOME/SELF CARE  Per UR Regulation:  Reviewed for med. necessity/level of care/duration of stay

## 2013-08-26 ENCOUNTER — Encounter (HOSPITAL_COMMUNITY): Payer: Self-pay | Admitting: Orthopaedic Surgery
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
## 2013-09-13 ENCOUNTER — Ambulatory Visit: Payer: BC Managed Care – PPO | Attending: Orthopaedic Surgery | Admitting: Physical Therapy

## 2013-09-13 DIAGNOSIS — R5381 Other malaise: Secondary | ICD-10-CM | POA: Diagnosis not present

## 2013-09-13 DIAGNOSIS — IMO0001 Reserved for inherently not codable concepts without codable children: Secondary | ICD-10-CM | POA: Diagnosis not present

## 2013-09-13 DIAGNOSIS — M25569 Pain in unspecified knee: Secondary | ICD-10-CM | POA: Diagnosis not present

## 2013-09-13 DIAGNOSIS — M25669 Stiffness of unspecified knee, not elsewhere classified: Secondary | ICD-10-CM | POA: Diagnosis not present

## 2013-09-15 ENCOUNTER — Ambulatory Visit: Payer: BC Managed Care – PPO | Admitting: Physical Therapy

## 2013-09-15 DIAGNOSIS — IMO0001 Reserved for inherently not codable concepts without codable children: Secondary | ICD-10-CM | POA: Diagnosis not present

## 2013-09-17 ENCOUNTER — Encounter: Payer: BC Managed Care – PPO | Admitting: Physical Therapy

## 2013-09-20 ENCOUNTER — Ambulatory Visit: Payer: BC Managed Care – PPO | Admitting: Physical Therapy

## 2013-09-20 DIAGNOSIS — IMO0001 Reserved for inherently not codable concepts without codable children: Secondary | ICD-10-CM | POA: Diagnosis not present

## 2013-09-22 ENCOUNTER — Ambulatory Visit: Payer: BC Managed Care – PPO | Attending: Orthopaedic Surgery | Admitting: Physical Therapy

## 2013-09-22 DIAGNOSIS — M25669 Stiffness of unspecified knee, not elsewhere classified: Secondary | ICD-10-CM | POA: Insufficient documentation

## 2013-09-22 DIAGNOSIS — M25569 Pain in unspecified knee: Secondary | ICD-10-CM | POA: Diagnosis not present

## 2013-09-22 DIAGNOSIS — R5381 Other malaise: Secondary | ICD-10-CM | POA: Insufficient documentation

## 2013-09-22 DIAGNOSIS — IMO0001 Reserved for inherently not codable concepts without codable children: Secondary | ICD-10-CM | POA: Diagnosis present

## 2013-09-24 ENCOUNTER — Ambulatory Visit: Payer: BC Managed Care – PPO | Admitting: Physical Therapy

## 2013-09-24 DIAGNOSIS — IMO0001 Reserved for inherently not codable concepts without codable children: Secondary | ICD-10-CM | POA: Diagnosis not present

## 2013-09-29 ENCOUNTER — Ambulatory Visit: Payer: BC Managed Care – PPO | Admitting: *Deleted

## 2013-09-29 DIAGNOSIS — IMO0001 Reserved for inherently not codable concepts without codable children: Secondary | ICD-10-CM | POA: Diagnosis not present

## 2013-10-01 ENCOUNTER — Ambulatory Visit: Payer: BC Managed Care – PPO | Admitting: *Deleted

## 2013-10-01 DIAGNOSIS — IMO0001 Reserved for inherently not codable concepts without codable children: Secondary | ICD-10-CM | POA: Diagnosis not present

## 2013-10-04 ENCOUNTER — Ambulatory Visit: Payer: BC Managed Care – PPO | Admitting: Physical Therapy

## 2013-10-04 DIAGNOSIS — IMO0001 Reserved for inherently not codable concepts without codable children: Secondary | ICD-10-CM | POA: Diagnosis not present

## 2013-10-06 ENCOUNTER — Ambulatory Visit: Payer: BC Managed Care – PPO | Admitting: Physical Therapy

## 2013-10-06 DIAGNOSIS — IMO0001 Reserved for inherently not codable concepts without codable children: Secondary | ICD-10-CM | POA: Diagnosis not present

## 2013-10-07 ENCOUNTER — Ambulatory Visit: Payer: BC Managed Care – PPO | Admitting: Physical Therapy

## 2013-10-07 DIAGNOSIS — IMO0001 Reserved for inherently not codable concepts without codable children: Secondary | ICD-10-CM | POA: Diagnosis not present

## 2013-10-08 ENCOUNTER — Ambulatory Visit: Payer: BC Managed Care – PPO | Admitting: Physical Therapy

## 2013-10-11 ENCOUNTER — Ambulatory Visit: Payer: BC Managed Care – PPO | Admitting: Physical Therapy

## 2013-10-11 DIAGNOSIS — IMO0001 Reserved for inherently not codable concepts without codable children: Secondary | ICD-10-CM | POA: Diagnosis not present

## 2013-10-13 ENCOUNTER — Ambulatory Visit: Payer: BC Managed Care – PPO | Admitting: Physical Therapy

## 2013-10-13 DIAGNOSIS — IMO0001 Reserved for inherently not codable concepts without codable children: Secondary | ICD-10-CM | POA: Diagnosis not present

## 2013-10-15 ENCOUNTER — Ambulatory Visit: Payer: BC Managed Care – PPO | Admitting: *Deleted

## 2013-10-15 DIAGNOSIS — IMO0001 Reserved for inherently not codable concepts without codable children: Secondary | ICD-10-CM | POA: Diagnosis not present

## 2013-10-18 ENCOUNTER — Ambulatory Visit: Payer: BC Managed Care – PPO | Admitting: Physical Therapy

## 2013-10-20 ENCOUNTER — Ambulatory Visit: Payer: BC Managed Care – PPO | Admitting: *Deleted

## 2013-10-20 DIAGNOSIS — IMO0001 Reserved for inherently not codable concepts without codable children: Secondary | ICD-10-CM | POA: Diagnosis not present

## 2013-10-22 ENCOUNTER — Ambulatory Visit: Payer: BC Managed Care – PPO | Attending: Orthopaedic Surgery | Admitting: Physical Therapy

## 2013-10-22 DIAGNOSIS — R5381 Other malaise: Secondary | ICD-10-CM | POA: Insufficient documentation

## 2013-10-22 DIAGNOSIS — Z5189 Encounter for other specified aftercare: Secondary | ICD-10-CM | POA: Insufficient documentation

## 2013-10-22 DIAGNOSIS — M25661 Stiffness of right knee, not elsewhere classified: Secondary | ICD-10-CM | POA: Insufficient documentation

## 2013-10-22 DIAGNOSIS — M25561 Pain in right knee: Secondary | ICD-10-CM | POA: Diagnosis not present

## 2013-10-27 ENCOUNTER — Encounter: Payer: BC Managed Care – PPO | Admitting: Physical Therapy

## 2014-04-19 ENCOUNTER — Other Ambulatory Visit: Payer: Self-pay | Admitting: Obstetrics and Gynecology

## 2014-04-19 DIAGNOSIS — R928 Other abnormal and inconclusive findings on diagnostic imaging of breast: Secondary | ICD-10-CM

## 2014-04-22 ENCOUNTER — Ambulatory Visit
Admission: RE | Admit: 2014-04-22 | Discharge: 2014-04-22 | Disposition: A | Discharge status: Home / Self Care | Payer: BLUE CROSS/BLUE SHIELD | Source: Ambulatory Visit | Attending: Obstetrics and Gynecology | Admitting: Obstetrics and Gynecology

## 2014-04-22 DIAGNOSIS — R928 Other abnormal and inconclusive findings on diagnostic imaging of breast: Secondary | ICD-10-CM
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
## 2017-09-11 ENCOUNTER — Encounter (INDEPENDENT_AMBULATORY_CARE_PROVIDER_SITE_OTHER): Payer: Self-pay | Admitting: Orthopaedic Surgery

## 2017-09-11 ENCOUNTER — Ambulatory Visit (INDEPENDENT_AMBULATORY_CARE_PROVIDER_SITE_OTHER): Payer: Self-pay

## 2017-09-11 ENCOUNTER — Ambulatory Visit (INDEPENDENT_AMBULATORY_CARE_PROVIDER_SITE_OTHER): Payer: BC Managed Care – PPO | Admitting: Orthopaedic Surgery

## 2017-09-11 VITALS — BP 146/87 | HR 102 | Ht 64.0 in | Wt 160.0 lb

## 2017-09-11 DIAGNOSIS — M25522 Pain in left elbow: Secondary | ICD-10-CM

## 2017-09-11 DIAGNOSIS — M7712 Lateral epicondylitis, left elbow: Secondary | ICD-10-CM | POA: Insufficient documentation

## 2017-09-11 MED ORDER — LIDOCAINE HCL 1 % IJ SOLN
0.5000 mL | INTRAMUSCULAR | Status: AC | PRN
  Administered 2017-09-11: .5 mL

## 2017-09-11 MED ORDER — METHYLPREDNISOLONE ACETATE 40 MG/ML IJ SUSP
40.0000 mg | INTRAMUSCULAR | Status: AC | PRN
  Administered 2017-09-11: 40 mg via INTRA_ARTICULAR

## 2017-09-11 MED ORDER — BUPIVACAINE HCL 0.5 % IJ SOLN
1.0000 mL | INTRAMUSCULAR | Status: AC | PRN
  Administered 2017-09-11: 1 mL via INTRA_ARTICULAR

## 2017-09-11 NOTE — Progress Notes (Signed)
Office Visit Note   Patient: Sarah Vaughan           Date of Birth: 10/22/1970           MRN: [id number] Visit Date: 09/11/2017              Requested by: No referring provider defined for this encounter. PCP: Joette CatchingNyland, Leonard, MD   Assessment & Plan: Visit Diagnoses:  1. Pain in left elbow   2. Lateral epicondylitis, left elbow     Plan: Lateral epicondyle injection performed with the relief.  Tennis elbow brace applied we discussed pathophysiology of condition and discussed activity modification.  She will return if she has persistent problems.  Follow-Up Instructions: Return if symptoms worsen or fail to improve.   Orders:  Orders Placed This Encounter  Procedures  . Medium Joint Inj  . XR Elbow 2 Views Left   No orders of the defined types were placed in this encounter.     Procedures: Medium Joint Inj: L lateral epicondyle on 09/11/2017 9:44 AM Indications: pain Details: 22 G 1.5 in needle, anterolateral approach Medications: 1 mL bupivacaine 0.5 %; 40 mg methylPREDNISolone acetate 40 MG/ML; 0.5 mL lidocaine 1 % Outcome: tolerated well, no immediate complications Procedure, treatment alternatives, risks and benefits explained, specific risks discussed. Consent was given by the patient. Immediately prior to procedure a time out was called to verify the correct patient, procedure, equipment, support staff and site/side marked as required. Patient was prepped and draped in the usual sterile fashion.       Clinical Data: No additional findings.   Subjective: Chief Complaint  Patient presents with  . Left Elbow - Pain    HPI 47 year old female with chronic left lateral elbow pain x5 weeks she is right-hand dominant she runs several childcare centers.  She has had pain with gripping squeezing she does not recall any specific injury.  She denies associated neck pain no chills or fever no other rheumatologic conditions negative for gout.  Review of Systems  patient had previous patellar surgery on the right by me several years ago.  14 point review of systems otherwise negative as it pertains HPI.   Objective: Vital Signs: BP (!) 146/87   Pulse (!) 102   Ht 5\' 4"  (1.626 m)   Wt 160 lb (72.6 kg)   BMI 27.46 kg/m   Physical Exam  Constitutional: She is oriented to person, place, and time. She appears well-developed.  HENT:  Head: Normocephalic.  Right Ear: External ear normal.  Left Ear: External ear normal.  Eyes: Pupils are equal, round, and reactive to light.  Neck: No tracheal deviation present. No thyromegaly present.  Cardiovascular: Normal rate.  Pulmonary/Chest: Effort normal.  Abdominal: Soft.  Neurological: She is alert and oriented to person, place, and time.  Skin: Skin is warm and dry.  Psychiatric: She has a normal mood and affect. Her behavior is normal.    Ortho Exam well-healed right midline knee incision with some crepitus in the extension full extension flexion past 130 collateral ligaments are stable.  No knee effusion.  No tenderness full range of motion right elbow.  Left lateral epicondyle exquisitely tender pain with resisted wrist extension no palpable defect collateral ligaments are stable.  No joint effusion ulnar nerve is normal median nerve at the wrist is normal good shoulder stability upper extremity reflexes 2+ and symmetrical no supraclavicular lymphadenopathy full cervical range of motion no brachial plexus tenderness.  Specialty Comments:  No specialty  comments available.  Imaging: Xr Elbow 2 Views Left  Result Date: 09/11/2017 AP lateral left elbow x-rays obtained and reviewed.  This shows no degenerative changes no acute changes.  Soft tissue calcification. Impression: normal left elbow x-rays.    PMFS History: Patient Active Problem List   Diagnosis Date Noted  . Patellar fracture 08/22/2013   Past Medical History:  Diagnosis Date  . Thyroid disease     Family History  Problem Relation  Age of Onset  . Hyperlipidemia Mother   . Thyroid disease Mother   . Thyroid disease Sister     Past Surgical History:  Procedure Laterality Date  . ORIF PATELLA Right 08/22/2013   Procedure: OPEN REDUCTION INTERNAL (ORIF) FIXATION PATELLA;  Surgeon: Eldred Manges, MD;  Location: [facility] OR;  Service: Orthopedics;  Laterality: Right;  . SPINE SURGERY     Social History   Occupational History  . Not on file  Tobacco Use  . Smoking status: Former Games developer  . Smokeless tobacco: Never Used  Substance and Sexual Activity  . Alcohol use: Yes    Comment: SOCIAL  . Drug use: No  . Sexual activity: Not on file

## 2019-06-17 ENCOUNTER — Ambulatory Visit: Payer: Self-pay | Admitting: Orthopaedic Surgery

## 2019-06-17 ENCOUNTER — Other Ambulatory Visit: Payer: Self-pay

## 2019-12-22 ENCOUNTER — Ambulatory Visit: Payer: Self-pay | Admitting: Orthopaedic Surgery

## 2023-01-29 ENCOUNTER — Other Ambulatory Visit: Payer: Self-pay

## 2023-01-29 ENCOUNTER — Ambulatory Visit (INDEPENDENT_AMBULATORY_CARE_PROVIDER_SITE_OTHER): Payer: Self-pay | Admitting: Orthopaedic Surgery

## 2023-01-29 ENCOUNTER — Encounter: Payer: Self-pay | Admitting: Orthopaedic Surgery

## 2023-01-29 VITALS — Ht 64.0 in | Wt 145.0 lb

## 2023-01-29 DIAGNOSIS — S8000XA Contusion of unspecified knee, initial encounter: Secondary | ICD-10-CM | POA: Insufficient documentation

## 2023-01-29 DIAGNOSIS — S8001XA Contusion of right knee, initial encounter: Secondary | ICD-10-CM

## 2023-01-29 DIAGNOSIS — M25561 Pain in right knee: Secondary | ICD-10-CM
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
## 2023-11-24 ENCOUNTER — Encounter: Payer: Self-pay | Admitting: Radiology
